# Patient Record
Sex: Female | Born: 1955 | Race: White | Hispanic: No | Marital: Married | Smoking: Never smoker
Health system: Southern US, Community
[De-identification: ages and names within clinical notes are randomized; demographics above are authoritative.]

## PROBLEM LIST (undated history)

## (undated) DIAGNOSIS — I10 Essential (primary) hypertension: Secondary | ICD-10-CM

## (undated) DIAGNOSIS — G500 Trigeminal neuralgia: Secondary | ICD-10-CM

## (undated) DIAGNOSIS — C4490 Unspecified malignant neoplasm of skin, unspecified: Secondary | ICD-10-CM

## (undated) HISTORY — PX: BREAST LUMPECTOMY: SHX2

## (undated) HISTORY — PX: TUBAL LIGATION: SHX77

## (undated) HISTORY — DX: Trigeminal neuralgia: G50.0

## (undated) HISTORY — PX: OTHER SURGICAL HISTORY: SHX169

---

## 2012-05-18 ENCOUNTER — Emergency Department (HOSPITAL_COMMUNITY): Payer: BC Managed Care – PPO

## 2012-05-18 ENCOUNTER — Encounter (HOSPITAL_COMMUNITY): Payer: Self-pay | Admitting: Emergency Medicine

## 2012-05-18 ENCOUNTER — Emergency Department (HOSPITAL_COMMUNITY)
Admission: EM | Admit: 2012-05-18 | Discharge: 2012-05-18 | Disposition: A | Discharge status: Home / Self Care | Payer: BC Managed Care – PPO | Attending: Emergency Medicine | Admitting: Emergency Medicine

## 2012-05-18 DIAGNOSIS — Z880 Allergy status to penicillin: Secondary | ICD-10-CM | POA: Insufficient documentation

## 2012-05-18 DIAGNOSIS — IMO0002 Reserved for concepts with insufficient information to code with codable children: Secondary | ICD-10-CM | POA: Insufficient documentation

## 2012-05-18 DIAGNOSIS — S93409A Sprain of unspecified ligament of unspecified ankle, initial encounter: Secondary | ICD-10-CM

## 2012-05-18 DIAGNOSIS — S96819A Strain of other specified muscles and tendons at ankle and foot level, unspecified foot, initial encounter: Secondary | ICD-10-CM | POA: Insufficient documentation

## 2012-05-18 DIAGNOSIS — S93499A Sprain of other ligament of unspecified ankle, initial encounter: Secondary | ICD-10-CM | POA: Insufficient documentation

## 2012-05-18 DIAGNOSIS — S6390XA Sprain of unspecified part of unspecified wrist and hand, initial encounter: Secondary | ICD-10-CM

## 2012-05-18 DIAGNOSIS — W010XXA Fall on same level from slipping, tripping and stumbling without subsequent striking against object, initial encounter: Secondary | ICD-10-CM | POA: Insufficient documentation

## 2012-05-18 DIAGNOSIS — G500 Trigeminal neuralgia: Secondary | ICD-10-CM | POA: Insufficient documentation

## 2012-05-18 MED ORDER — TRAMADOL HCL 50 MG PO TABS: 50.0000 mg | ORAL_TABLET | Freq: Four times a day (QID) | ORAL | Status: AC | PRN

## 2012-05-18 NOTE — ED Provider Notes (Signed)
History     CSN: [id number]  Arrival date & time 05/18/12  1029   First MD Initiated Contact with Patient 05/18/12 1043      Chief Complaint  Patient presents with  . Ankle Pain  . Hand Pain    (Consider location/radiation/quality/duration/timing/severity/associated sxs/prior treatment) Patient is a 56 y.o. female presenting with fall. The history is provided by the patient.  Fall The accident occurred yesterday. The fall occurred while walking. The pain is present in the left wrist (left ankle). The pain is at a severity of 5/10. Pertinent negatives include no fever and no numbness.  Pt states she got tripped by a dog, and she fell down onto left side. States pain and swelling to left ankle and left hand. Pain with walking, bearing weight, moving left thumb   Past Medical History  Diagnosis Date  . Trigeminal neuralgia     Past Surgical History  Procedure Date  . Endometrial ablasion     No family history on file.  History  Substance Use Topics  . Smoking status: Never Smoker   . Smokeless tobacco: Not on file  . Alcohol Use: Yes    OB History    Grav Para Term Preterm Abortions TAB SAB Ect Mult Living                  Review of Systems  Constitutional: Negative for fever and chills.  Respiratory: Negative.   Cardiovascular: Negative.   Musculoskeletal: Positive for joint swelling.  Skin: Negative.   Neurological: Negative for weakness and numbness.    Allergies  Penicillins  Home Medications   Current Outpatient Rx  Name Route Sig Dispense Refill  . GABAPENTIN 100 MG PO CAPS Oral Take 200 mg by mouth 2 (two) times daily.    Marland Kitchen NAPROXEN SODIUM 220 MG PO TABS Oral Take 220 mg by mouth daily as needed. For pain      BP 126/77  Pulse 89  Temp 98.4 F (36.9 C) (Oral)  Resp 16  Ht 5\' 8"  (1.727 m)  Wt 200 lb (90.719 kg)  BMI 30.41 kg/m2  SpO2 98%  Physical Exam  Nursing note and vitals reviewed. Constitutional: She is oriented to person, place,  and time. She appears well-developed and well-nourished. No distress.  HENT:  Head: Normocephalic.  Eyes: Conjunctivae are normal.  Cardiovascular: Normal rate, regular rhythm and normal heart sounds.   Pulmonary/Chest: Effort normal and breath sounds normal. No respiratory distress. She has no wheezes. She has no rales.  Musculoskeletal:       Normal appearing left hand. Pain with left thumb movement. Tenderness over thenar eminence. Normal wrist exam. Pain with palpation over medial and lateral malleoli of the left ankle. Ankle joint appears swollen. Tenderness over dorsum of the foot. Pain with rom of the toes. Dorsal pedal pulses normal  Neurological: She is alert and oriented to person, place, and time.  Skin: Skin is warm and dry.  Psychiatric: She has a normal mood and affect.    ED Course  Procedures (including critical care time)  12:32 PM No results found for this or any previous visit. Dg Ankle Complete Left  05/18/2012  *RADIOLOGY REPORT*  Clinical Data: Pain secondary to a fall.  LEFT ANKLE COMPLETE - 3+ VIEW  Comparison: None.  Findings: There is no fracture, dislocation, or joint effusion. Prominent dorsal spurring of the distal talus.  Small plantar and posterior calcaneal spurs.  IMPRESSION: No acute abnormality of the ankle.  Original Report  Authenticated By: Gwynn Burly, M.D.   Dg Hand Complete Left  05/18/2012  *RADIOLOGY REPORT*  Clinical Data: Pain secondary to a fall.  LEFT HAND - COMPLETE 3+ VIEW  Comparison: None.  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  No fracture or dislocation.  Minimal degenerative arthritic changes of the interphalangeal joints of the fingers.  IMPRESSION: No acute osseous abnormality.  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  Original Report Authenticated By: Gwynn Burly, M.D.   Dg Foot Complete Left  05/18/2012  *RADIOLOGY REPORT*   Clinical Data: Fall.  Mid foot pain.  LEFT FOOT - COMPLETE 3+ VIEW  Comparison: None.  Findings: No acute bony or joint abnormality is identified.  There is some calcaneal spurring.  Small accessory ossicle of the navicular bone is noted.  Soft tissues unremarkable.  IMPRESSION: No acute finding.  Original Report Authenticated By: Bernadene Bell. D'ALESSIO, M.D.    X-rays negative. Neurovascularly intact. VS normal. Suspect ankle and hand sprain. Given crutches and ASO brace. D/c home with close follow up.    1. Ankle sprain   2. Hand sprain       MDM          Lottie Mussel, PA 05/18/12 1556

## 2012-05-18 NOTE — ED Notes (Signed)
Pt reports a dog tripped her last night causing her to fall and waking up with left ankle pain/swelling and left hand pain

## 2012-05-19 NOTE — ED Provider Notes (Signed)
Medical screening examination/treatment/procedure(s) were performed by non-physician practitioner and as supervising physician I was immediately available for consultation/collaboration.  Traquan Duarte, MD 05/19/12 0759 

## 2013-06-25 IMAGING — CR DG HAND COMPLETE 3+V*L*
3 series · 3 of 3 positions shown · non-contrast
Comparison: None.

CLINICAL DATA: Pain secondary to a fall.

LEFT HAND - COMPLETE 3+ VIEW

[x hand pa left]
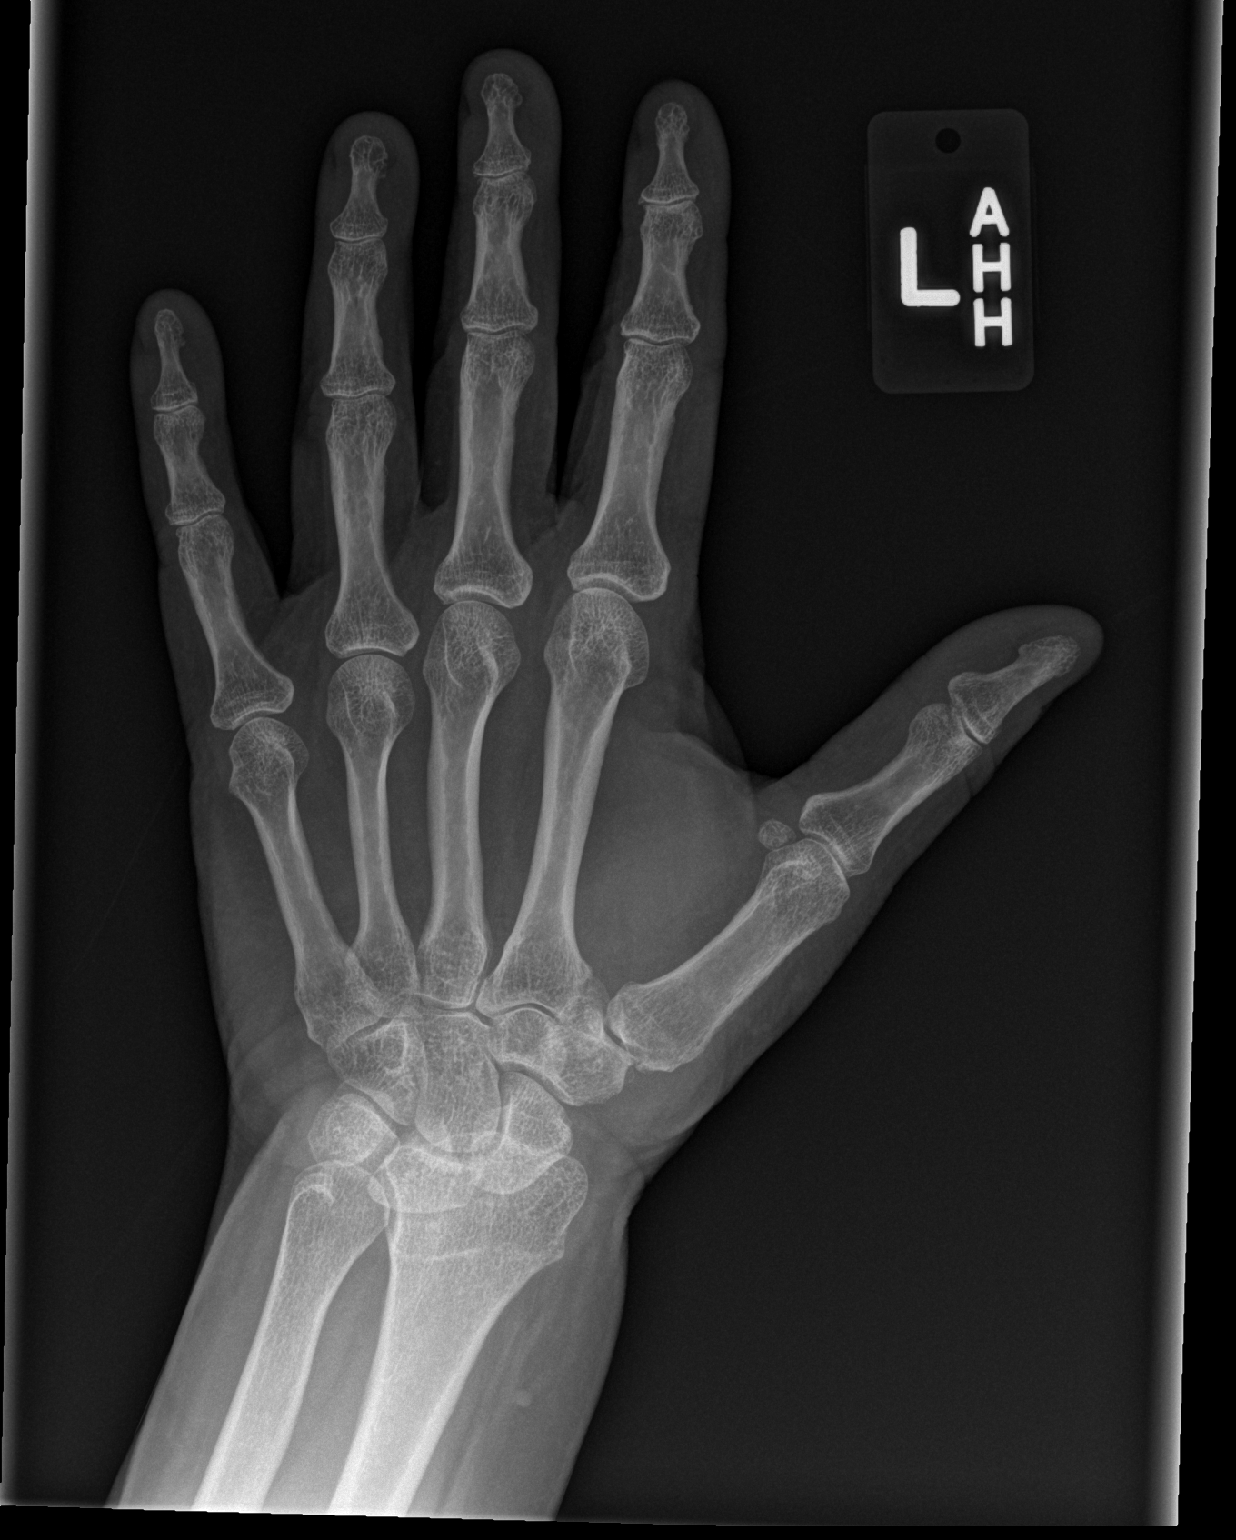

[x hand obl left]
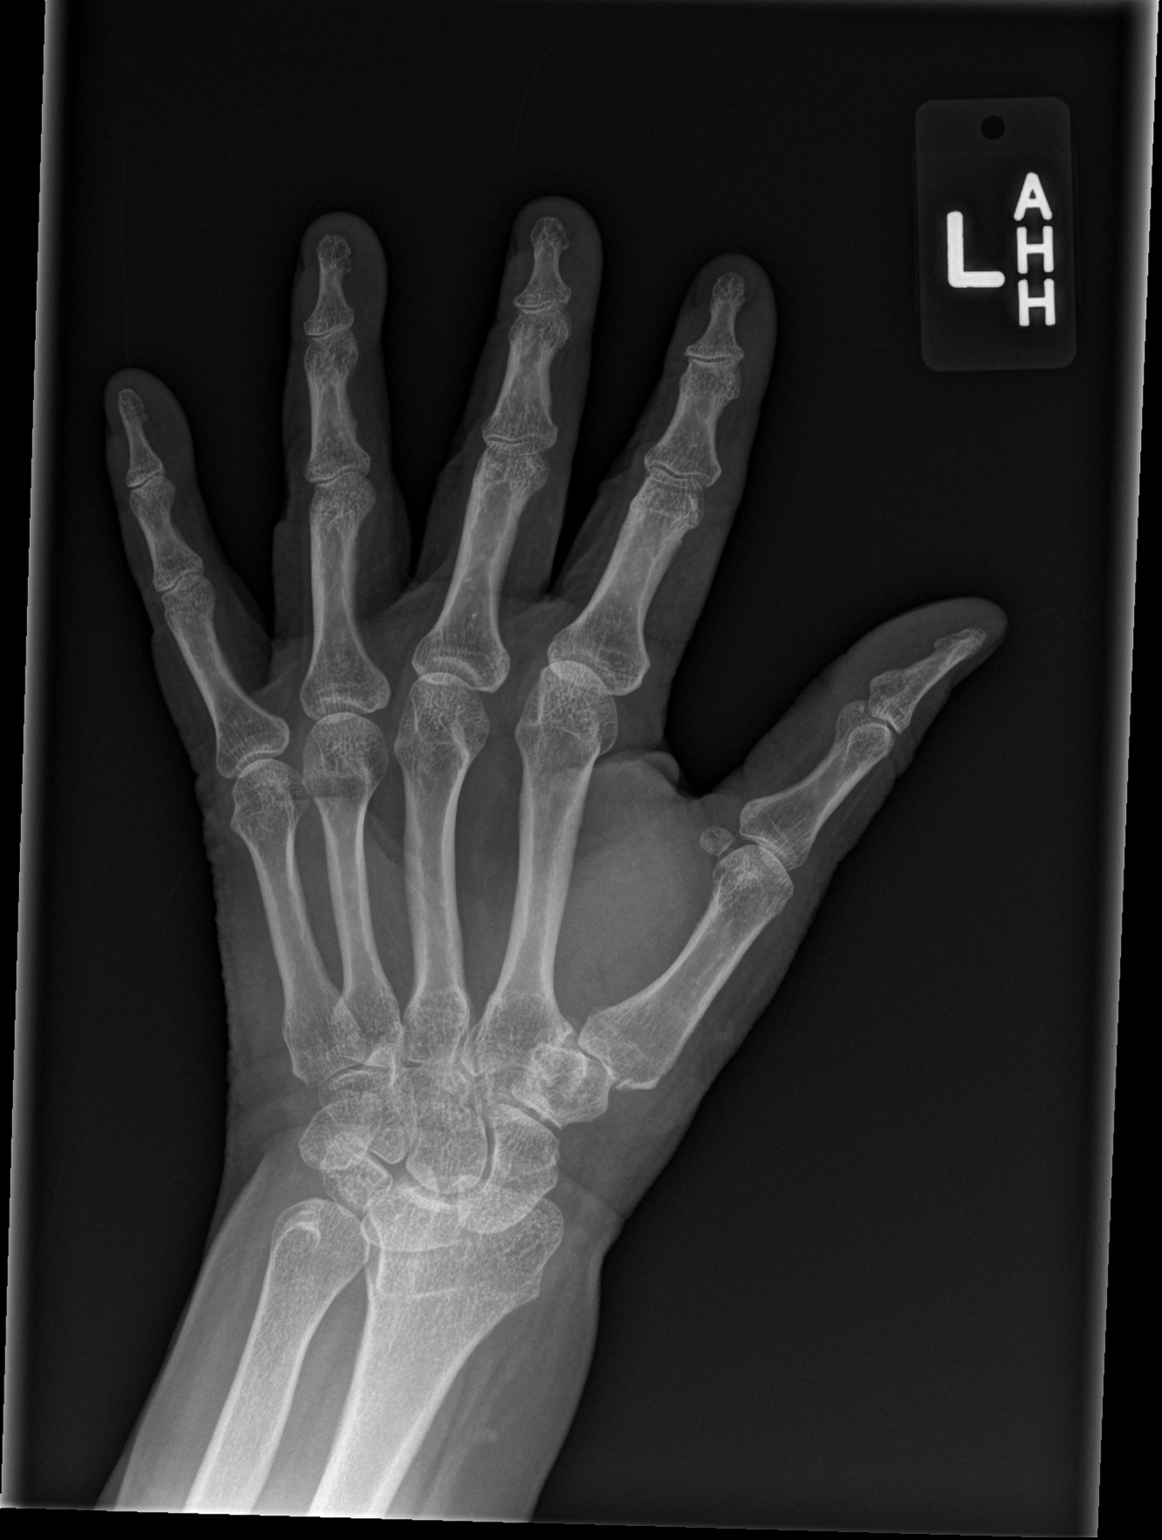

[x hand lat left]
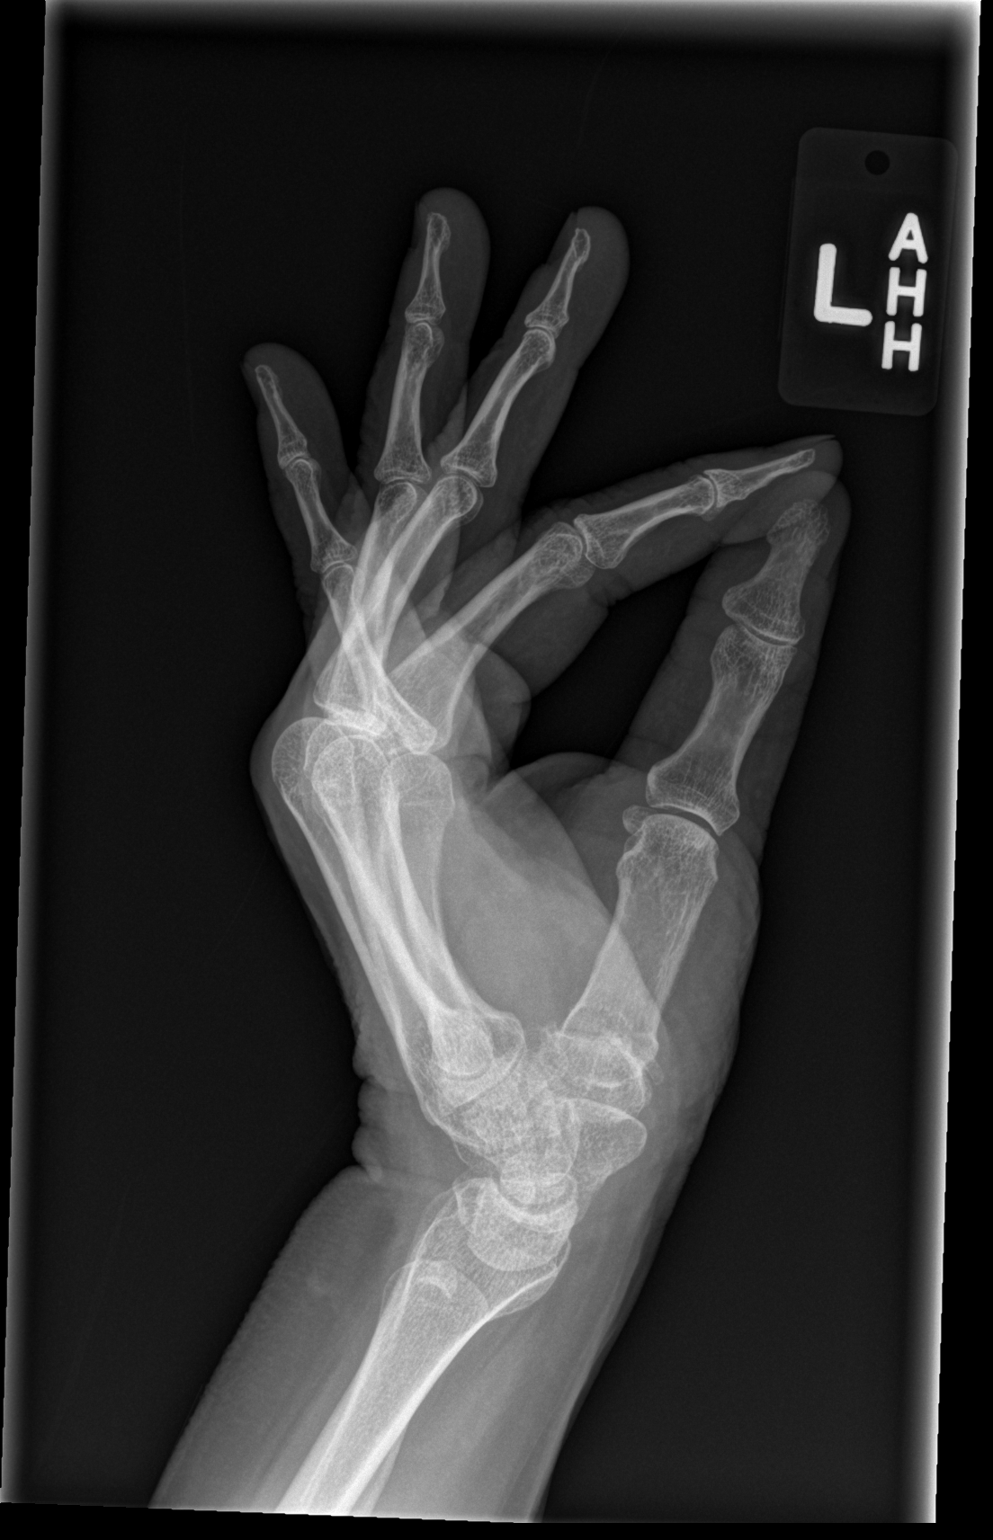

[3 of 3 positions shown; findings below may reference images not displayed]

FINDINGS: The patient has slight osteoarthritic changes of the
first carpal metacarpal joint with osteophyte formation.  No
fracture or dislocation.  Minimal degenerative arthritic changes of
the interphalangeal joints of the fingers.
IMPRESSION: No acute osseous abnormality.  Arthritic changes primarily at the
first carpal metacarpal joint.

## 2014-05-05 ENCOUNTER — Emergency Department (HOSPITAL_COMMUNITY)
Admission: EM | Admit: 2014-05-05 | Discharge: 2014-05-05 | Disposition: A | Discharge status: Home / Self Care | Payer: 59 | Attending: Emergency Medicine | Admitting: Emergency Medicine

## 2014-05-05 ENCOUNTER — Emergency Department (HOSPITAL_COMMUNITY): Payer: 59

## 2014-05-05 ENCOUNTER — Encounter (HOSPITAL_COMMUNITY): Payer: Self-pay | Admitting: Emergency Medicine

## 2014-05-05 DIAGNOSIS — Z792 Long term (current) use of antibiotics: Secondary | ICD-10-CM | POA: Insufficient documentation

## 2014-05-05 DIAGNOSIS — Z880 Allergy status to penicillin: Secondary | ICD-10-CM | POA: Insufficient documentation

## 2014-05-05 DIAGNOSIS — Z79899 Other long term (current) drug therapy: Secondary | ICD-10-CM | POA: Insufficient documentation

## 2014-05-05 DIAGNOSIS — Z8669 Personal history of other diseases of the nervous system and sense organs: Secondary | ICD-10-CM | POA: Insufficient documentation

## 2014-05-05 DIAGNOSIS — R51 Headache: Secondary | ICD-10-CM | POA: Insufficient documentation

## 2014-05-05 DIAGNOSIS — R519 Headache, unspecified: Secondary | ICD-10-CM

## 2014-05-05 MED ORDER — GABAPENTIN 300 MG PO CAPS: 300.0000 mg | ORAL_CAPSULE | Freq: Three times a day (TID) | ORAL | Status: DC

## 2014-05-05 NOTE — ED Notes (Addendum)
Pt c/o left side headache, sharp pain on left. Denies n/v blurred vision. Pt states left side of forehead is swollen and when pain happens it gets worse.

## 2014-05-05 NOTE — Progress Notes (Signed)
  CARE MANAGEMENT ED NOTE 05/05/2014  [facility]   Account Number:  [id number]  Date Initiated:  05/05/2014  Documentation initiated by:  Radford PaxFERRERO,Chrisanne Loose  Subjective/Objective Assessment:   Patient presents to Ed with left sided headache     Subjective/Objective Assessment Detail:     Action/Plan:   Action/Plan Detail:   Anticipated DC Date:       Status Recommendation to Physician:   Result of Recommendation:    Other ED Services  Consult Working Plan    DC Planning Services  Other  PCP issues    Choice offered to / List presented to:            Status of service:  Completed, signed off  ED Comments:   ED Comments Detail:  EDCM spoke to patient at bedside.  Patient confirms her pcp is [facility].  System updated.

## 2014-05-05 NOTE — ED Provider Notes (Signed)
CSN: [id number]     Arrival date & time 05/05/14  1539 History   First MD Initiated Contact with Patient 05/05/14 1901     Chief Complaint  Patient presents with  . Headache      HPI Patient reports intermittent shooting type left sided headache focused around her left eye without any significant changes in her vision.  She states when the pain comes it lasts for approximately 1 minute.  Her family reports that she gets a spasm appearance of her muscles around her left face and in her symptoms resolved.  She states the pain comes it is so severe she cannot talk or do any activities.  It then resolves and she is about a day.  She has a history of trigeminal neuralgia and she states this feels slightly similar but more severe.  She used to be on Neurontin but she is no longer prescribe this.  Approximately 2 and half weeks ago she started taking her sister's prescription and is currently taking 300 mg 3 times a day without significant relief in her symptoms.  She has not seen her primary care physician or neurologist about this.  She denies weakness or numbness of her upper lower extremities.  No other complaints.  No fevers or chills.  No ear pain.   Past Medical History  Diagnosis Date  . Trigeminal neuralgia    Past Surgical History  Procedure Laterality Date  . Endometrial ablasion     No family history on file. History  Substance Use Topics  . Smoking status: Never Smoker   . Smokeless tobacco: Not on file  . Alcohol Use: Yes   OB History   Grav Para Term Preterm Abortions TAB SAB Ect Mult Living                 Review of Systems  All other systems reviewed and are negative.     Allergies  Penicillins  Home Medications   Prior to Admission medications   Medication Sig Start Date End Date Taking? Authorizing Provider  ALPRAZolam Prudy Feeler(XANAX) 0.25 MG tablet Take 0.25 mg by mouth daily as needed for anxiety.   Yes Historical Provider, MD  azithromycin (AZASITE) 1 %  ophthalmic solution Place 1 drop into the left eye daily.   Yes Historical Provider, MD  gabapentin (NEURONTIN) 300 MG capsule Take 300 mg by mouth 3 (three) times daily.   Yes Historical Provider, MD  Multiple Vitamin (MULTIVITAMIN WITH MINERALS) TABS tablet Take 1 tablet by mouth daily.   Yes Historical Provider, MD  naproxen sodium (ANAPROX) 220 MG tablet Take 220 mg by mouth daily as needed. For pain   Yes Historical Provider, MD   BP 163/86  Pulse 89  Temp(Src) 97.9 F (36.6 C) (Oral)  Resp 16  SpO2 97% Physical Exam  Nursing note and vitals reviewed. Constitutional: She is oriented to person, place, and time. She appears well-developed and well-nourished. No distress.  HENT:  Head: Normocephalic and atraumatic.  No significant tenderness around her temporal region.  No skin changes.  No signs of periorbital cellulitis on the left.  No facial swelling or masses noted.  No tenderness along her left facial nerve at the origin  Eyes: EOM are normal. Pupils are equal, round, and reactive to light. Left eye exhibits no chemosis and no discharge. Left conjunctiva is not injected. Left conjunctiva has no hemorrhage. Left eye exhibits normal extraocular motion and no nystagmus.  Neck: Normal range of motion.  Cardiovascular: Normal rate,  regular rhythm and normal heart sounds.   Pulmonary/Chest: Effort normal and breath sounds normal.  Abdominal: Soft. She exhibits no distension. There is no tenderness.  Musculoskeletal: Normal range of motion.  Neurological: She is alert and oriented to person, place, and time.  Skin: Skin is warm and dry.  Psychiatric: She has a normal mood and affect. Judgment normal.    ED Course  Procedures (including critical care time) Labs Review Labs Reviewed - No data to display  Imaging Review Ct Head Wo Contrast  05/05/2014   CLINICAL DATA:  Left parietal and left eye pain for 2.5 years.  EXAM: CT HEAD WITHOUT CONTRAST  TECHNIQUE: Contiguous axial images  were obtained from the base of the skull through the vertex without intravenous contrast.  COMPARISON:  None.  FINDINGS: Ventricles and sulci appear symmetrical. No mass effect or midline shift. No abnormal extra-axial fluid collections. Gray-white matter junctions are distinct. Basal cisterns are not effaced. No evidence of acute intracranial hemorrhage. No depressed skull fractures. Visualized paranasal sinuses and mastoid air cells are not opacified.  IMPRESSION: No acute intracranial abnormalities.   Electronically Signed   By: Burman NievesWilliam  Stevens M.D.   On: 05/05/2014 21:27  I personally reviewed the imaging tests through PACS system I reviewed available ER/hospitalization records through the EMR    EKG Interpretation None      MDM   Final diagnoses:  Headache, unspecified headache type    Outpatient neurology followup.  Patient be prescribe Neurontin    Lyanne CoKevin M Campos, MD 05/05/14 2210

## 2014-05-13 ENCOUNTER — Ambulatory Visit (INDEPENDENT_AMBULATORY_CARE_PROVIDER_SITE_OTHER): Payer: 59 | Admitting: Neurology

## 2014-05-13 ENCOUNTER — Encounter: Payer: Self-pay | Admitting: Neurology

## 2014-05-13 VITALS — BP 131/86 | HR 79 | Ht 66.5 in | Wt 210.0 lb

## 2014-05-13 DIAGNOSIS — G500 Trigeminal neuralgia: Secondary | ICD-10-CM

## 2014-05-13 HISTORY — DX: Trigeminal neuralgia: G50.0

## 2014-05-13 MED ORDER — GABAPENTIN 600 MG PO TABS: 600.0000 mg | ORAL_TABLET | Freq: Three times a day (TID) | ORAL | Status: DC

## 2014-05-13 NOTE — Patient Instructions (Signed)
Trigeminal Neuralgia  Trigeminal neuralgia is a nerve disorder that causes sudden attacks of severe facial pain. It is caused by damage to the trigeminal nerve, a major nerve in the face. It is more common in women and in the elderly, although it can also happen in younger patients. Attacks last from a few seconds to several minutes and can occur from a couple of times per year to several times per day. Trigeminal neuralgia can be a very distressing and disabling condition. Surgery may be needed in very severe cases if medical treatment does not give relief.  HOME CARE INSTRUCTIONS    If your caregiver prescribed medication to help prevent attacks, take as directed.   To help prevent attacks:   Chew on the unaffected side of the mouth.   Avoid touching your face.   Avoid blasts of hot or cold air.   Men may wish to grow a beard to avoid having to shave.  SEEK IMMEDIATE MEDICAL CARE IF:   Pain is unbearable and your medicine does not help.   You develop new, unexplained symptoms (problems).   You have problems that may be related to a medication you are taking.  Document Released: 10/28/2000 Document Revised: 01/23/2012 Document Reviewed: 08/28/2009  ExitCare Patient Information 2015 [employer]. This information is not intended to replace advice given to you by your health care provider. Make sure you discuss any questions you have with your health care provider.

## 2014-05-13 NOTE — Progress Notes (Signed)
Reason for visit: Trigeminal neuralgia  Tracy Campbell is a 58 y.o. female  History of present illness:  Tracy Campbell is a 58 year old right-handed white female with a history of left V1 distribution neuralgia pain that began about 3 years ago. She was seen by Dr. 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 The patient was placed on gabapentin, taking 300 mg 3 times daily with good benefit. The patient stopped having neuralgia pain recently, and went off the medication for 6-8 months without any pain. The pain recurred 3 months ago, and the patient has gone back on her gabapentin taking 300 mg 3 times daily, but the pain has not come under complete control. She is still having at least 2 episodes of electrical shock pains around the left eye and nose and forehead that may last up to 30 seconds. Wind blowing on her face may bring on the episodes. Touching the left nose may also initiate the neuralgia pain. She also has dull achy pains all over her head off and on that are unrelated to the neuralgia pain. The patient however, also reports some numbness involving the left face. She recently was on prednisone for an eye infection, and this did help her neuralgia pain some. She denies any numbness or weakness of the extremities, and she denies any problems controlling the bowels or the bladder. She does report some vertigo sensations at times, and she will have some gait instability during that period of time that she is dizzy. She has not had any blackout episodes. She comes to this office for an evaluation.  Past Medical History  Diagnosis Date  . Trigeminal neuralgia of left side of face 05/13/2014    Past Surgical History  Procedure Laterality Date  . Endometrial ablasion    . Tubal ligation    . Breast lumpectomy      left benign    Family  History  Problem Relation Age of Onset  . Alzheimer's disease Mother   . Dementia Father     Social history:  reports that she has quit smoking. Her smoking use included Cigarettes. She smoked 0.00 packs per day. She has never used smokeless tobacco. She reports that she drinks alcohol. She reports that she does not use illicit drugs.  Medications:  Current Outpatient Prescriptions on File Prior to Visit  Medication Sig Dispense Refill  . ALPRAZolam (XANAX) 0.25 MG tablet Take 0.25 mg by mouth daily as needed for anxiety.      . Multiple Vitamin (MULTIVITAMIN WITH MINERALS) TABS tablet Take 1 tablet by mouth daily.      . naproxen sodium (ANAPROX) 220 MG tablet Take 220 mg by mouth daily as needed. For pain       No current facility-administered medications on file prior to visit.      Allergies  Allergen Reactions  . Penicillins Anaphylaxis    ROS:  Out of a complete 14 system review of symptoms, the patient complains only of the following symptoms, and all other reviewed systems are negative.  Vertigo Blurred vision, eye pain Shortness of breath, wheezing, snoring Feeling hot, flushing Headache, numbness, dizziness Anxiety, not enough sleep Sleepiness, snoring  Blood pressure 131/86, pulse 79, height 5' 6.5" (1.689 m), weight 210 lb (95.255 kg).  Physical Exam  General: The patient is alert and cooperative at the time of  the examination.  Eyes: Pupils are equal, round, and reactive to light. Discs are flat bilaterally.  Neck: The neck is supple, no carotid bruits are noted.  Respiratory: The respiratory examination is clear.  Cardiovascular: The cardiovascular examination reveals a regular rate and rhythm, no obvious murmurs or rubs are noted.  Skin: Extremities are without significant edema.  Neurologic Exam  Mental status: The patient is alert and oriented x 3 at the time of the examination. The patient has apparent normal recent and remote memory, with an  apparently normal attention span and concentration ability.  Cranial nerves: Facial symmetry is present. There is good sensation of the face to pinprick and soft touch on the right, decreased on the left. The strength of the facial muscles and the muscles to head turning and shoulder shrug are normal bilaterally. Speech is well enunciated, no aphasia or dysarthria is noted. Extraocular movements are full. Visual fields are full. The tongue is midline, and the patient has symmetric elevation of the soft palate. No obvious hearing deficits are noted.  Motor: The motor testing reveals 5 over 5 strength of all 4 extremities. Good symmetric motor tone is noted throughout.  Sensory: Sensory testing is intact to pinprick, soft touch, vibration sensation, and position sense on all 4 extremities, with the exception that the pinprick sensation is decreased on the left arm relative to the right. No evidence of extinction is noted.  Coordination: Cerebellar testing reveals good finger-nose-finger and heel-to-shin bilaterally.  Gait and station: Gait is normal. Tandem gait is normal. Romberg is negative. No drift is seen.  Reflexes: Deep tendon reflexes are symmetric and normal bilaterally. Toes are downgoing bilaterally.   CT head 05/05/2014:  IMPRESSION:  No acute intracranial abnormalities.    Assessment/Plan:  1. Left V1 trigeminal neuralgia  The patient reports a history of neuralgia-type pain on the left upper face that dates back about 3 years. The history and examination is atypical for trigeminal neuralgia in that she has sensory alteration on the entirety of the left face and on the left arm. The patient will be sent for a repeat MRI of the brain with and without gadolinium enhancement. She does not believe that she received gadolinium enhancement on the prior MRI. She will be increased on the gabapentin taking 600 mg 3 times a day, phasing in the 600 mg tablets by one every 5 days, replacing  the 300 mg tablets. The patient may require carbamazepine in addition if the gabapentin is not helpful. She will followup in 3-4 months.  Marlan Palau. Keith Willis MD 05/13/2014 9:34 PM  [facility] [address] [place], [place] [id number]  Phone [phone] Fax [phone]

## 2014-05-26 ENCOUNTER — Telehealth: Payer: Self-pay | Admitting: Neurology

## 2014-05-26 NOTE — Telephone Encounter (Signed)
I called back and spoke with the patient.  Says she does have a Rx for Xanax 0.25mg  but rarely takes them.  Indicates this is not strong enough and would prefer [facility] as a sedative for her take prior to MRI.  Please advise.  Thank you.

## 2014-05-26 NOTE — Telephone Encounter (Signed)
I called patient. The patient has the 0.25 mg tablets, she needs to take 4 tablets before she goes into the scanner, and had an extra 2 tablets to take if needed. The patient wants to have a scan done in an open scanner.

## 2014-05-26 NOTE — Telephone Encounter (Signed)
Patient is having an MRI on 05-28-14 and needs Rx called in to relax patient during MRI--[facility] [place]--thank you.

## 2014-05-28 ENCOUNTER — Telehealth: Payer: Self-pay | Admitting: Neurology

## 2014-05-28 ENCOUNTER — Ambulatory Visit
Admission: RE | Admit: 2014-05-28 | Discharge: 2014-05-28 | Disposition: A | Discharge status: Home / Self Care | Payer: 59 | Source: Ambulatory Visit | Attending: Neurology | Admitting: Neurology

## 2014-05-28 DIAGNOSIS — G500 Trigeminal neuralgia: Secondary | ICD-10-CM

## 2014-05-28 MED ORDER — GADOBENATE DIMEGLUMINE 529 MG/ML IV SOLN
20.0000 mL | Freq: Once | INTRAVENOUS | Status: AC | PRN
  Administered 2014-05-28: 20 mL via INTRAVENOUS

## 2014-05-28 NOTE — Telephone Encounter (Signed)
  I called patient. The MRI the brain shows an occasional subcortical white matter lesion that is stable from 2013. Study is otherwise unremarkable.   MRI brain 05/28/2014:  Impression   Abnormal MRI brain (with and without) demonstrating: 1. 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 Left peri-atrial cystic lesion. No abnormal lesions are seen on  post contrast views. These findings are non-specific and considerations  include autoimmune, inflammatory, post-infectious, microvascular ischemic  or migraine associated etiologies.  2. No acute findings. 3. No significant change from MRI on 03/12/12.

## 2014-09-12 ENCOUNTER — Ambulatory Visit: Payer: 59 | Admitting: Adult Health

## 2015-06-12 IMAGING — CT CT HEAD W/O CM
1 of 2 series · 16 of 30 positions shown, 20 images · non-contrast
Comparison: None.

CLINICAL DATA: Left parietal and left eye pain for 2.5 years.

EXAM:
CT HEAD WITHOUT CONTRAST
TECHNIQUE: Contiguous axial images were obtained from the base of the skull
through the vertex without intravenous contrast.

[Series 3: headseq 2.4 h60s · axial · 0.41mm/px · z∈[+1212,+1367]mm · 16 of 72 slices shown, 20 images]
[im 4/72  brain]
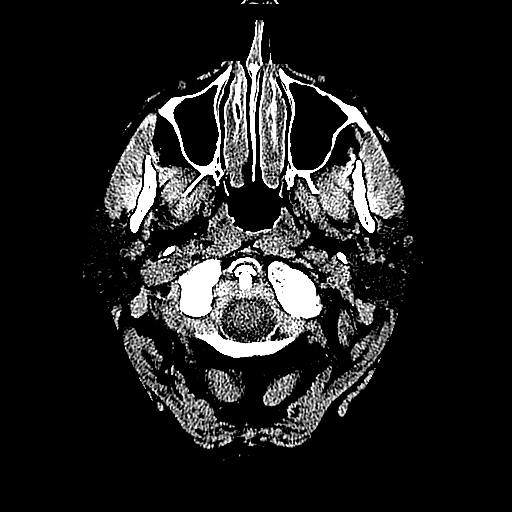
[im 4/72  bone]
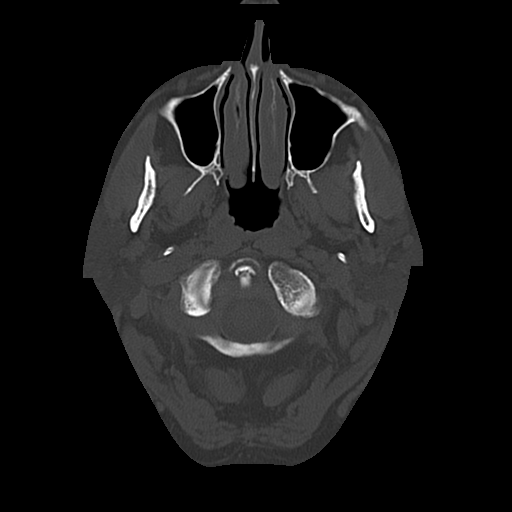
[im 8/72  brain]
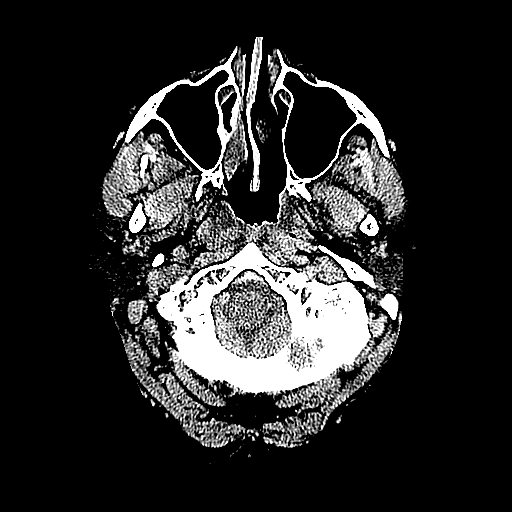
[im 12/72  brain]
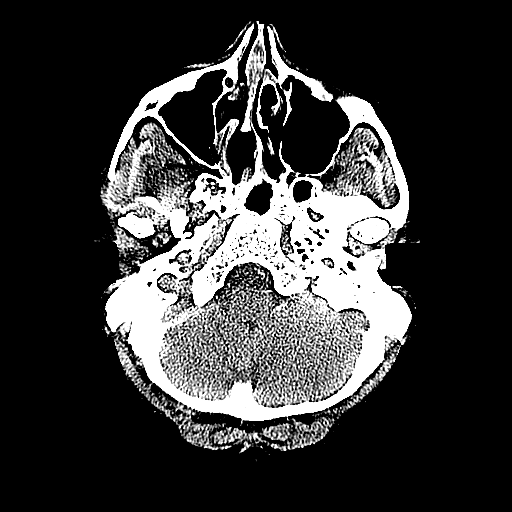
[im 15/72  brain]
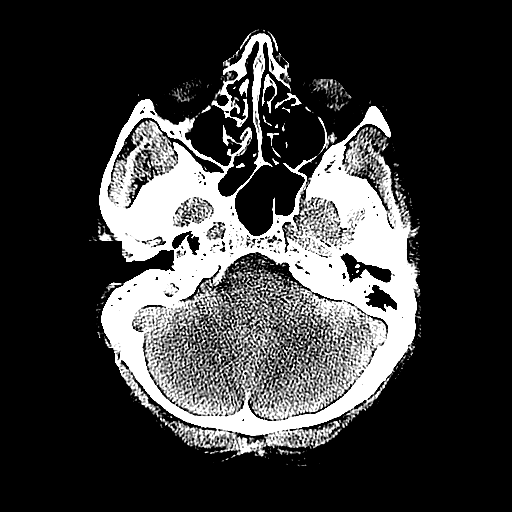
[im 19/72  brain]
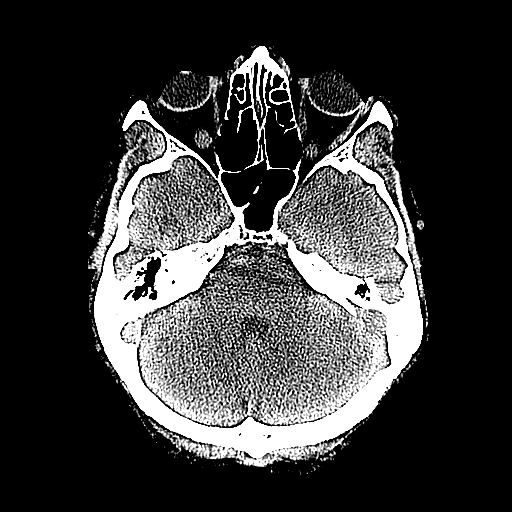
[im 19/72  bone]
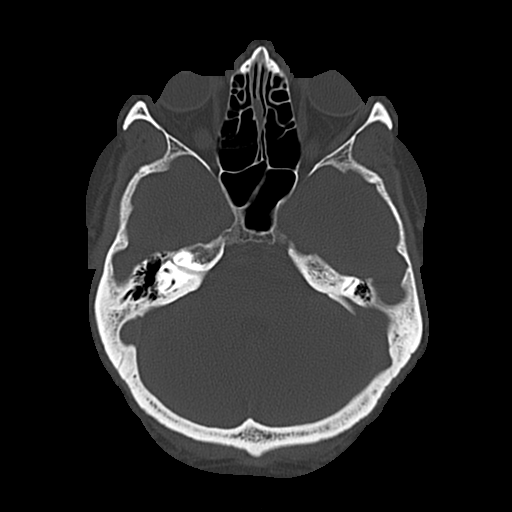
[im 23/72  brain]
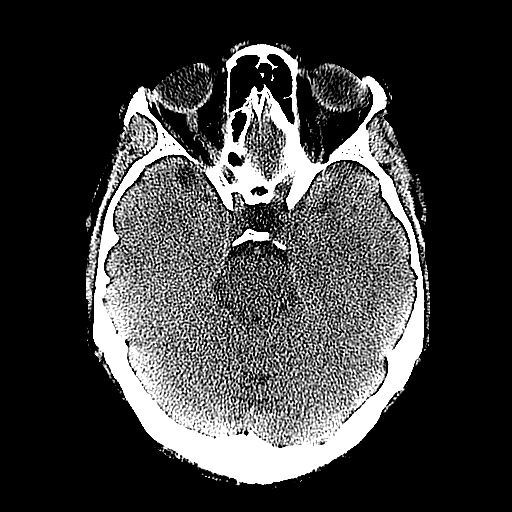
[im 27/72  brain]
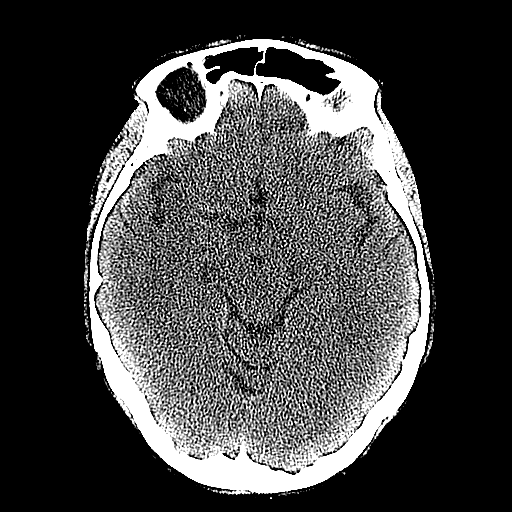
[im 30/72  brain]
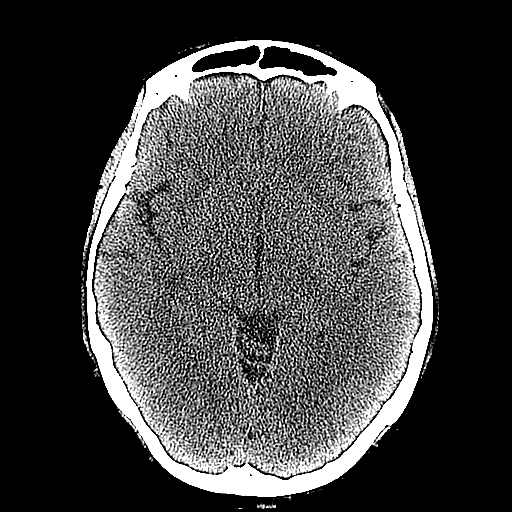
[im 38/72  brain]
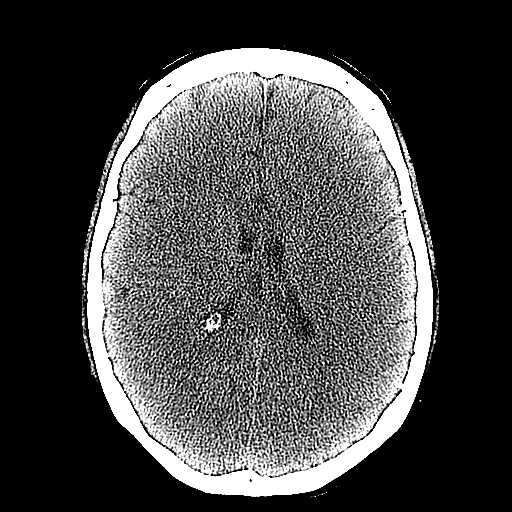
[im 38/72  bone]
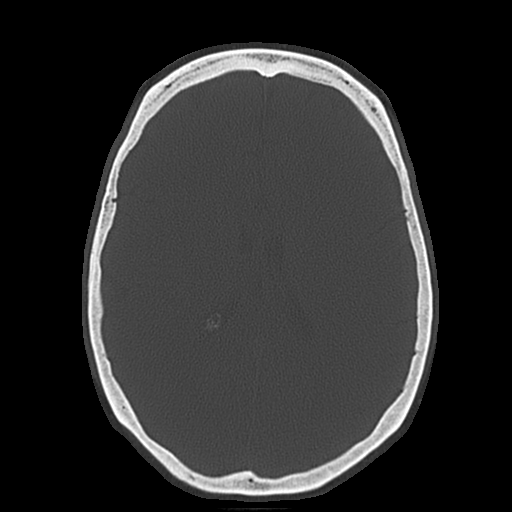
[im 42/72  brain]
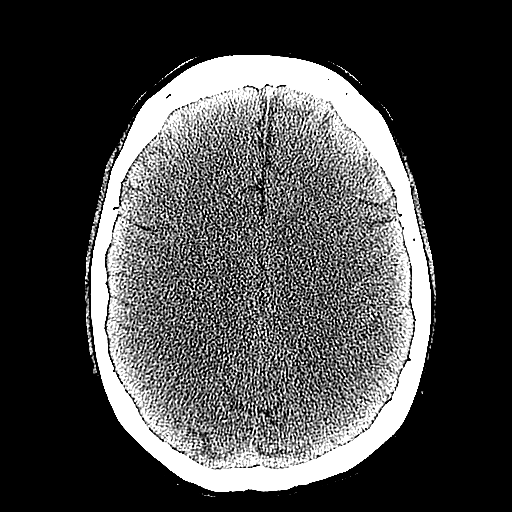
[im 45/72  brain]
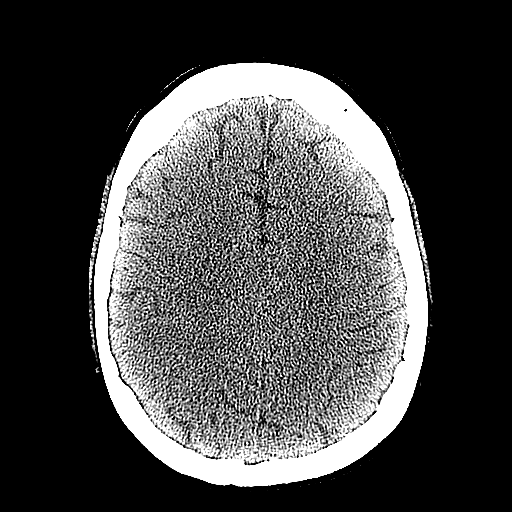
[im 49/72  brain]
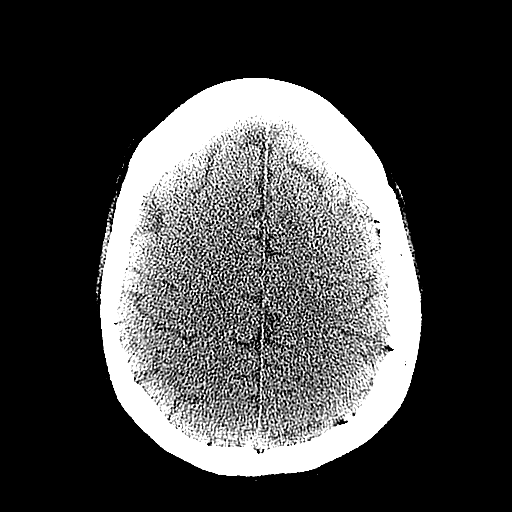
[im 57/72  brain]
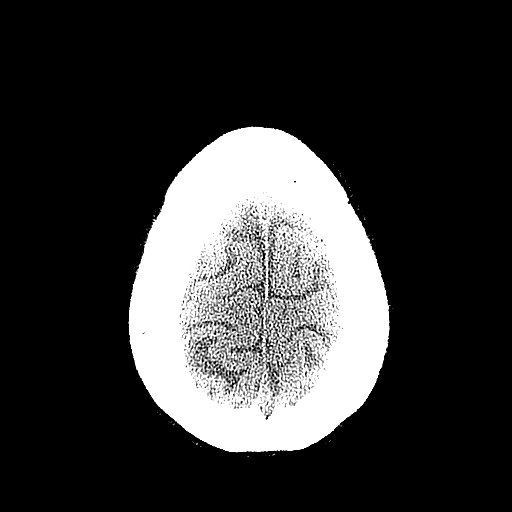
[im 57/72  bone]
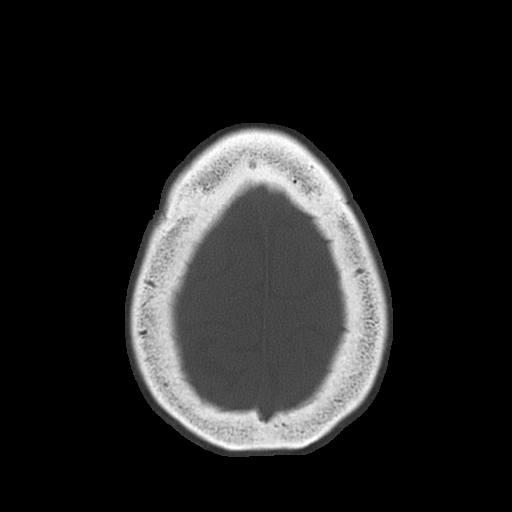
[im 60/72  brain]
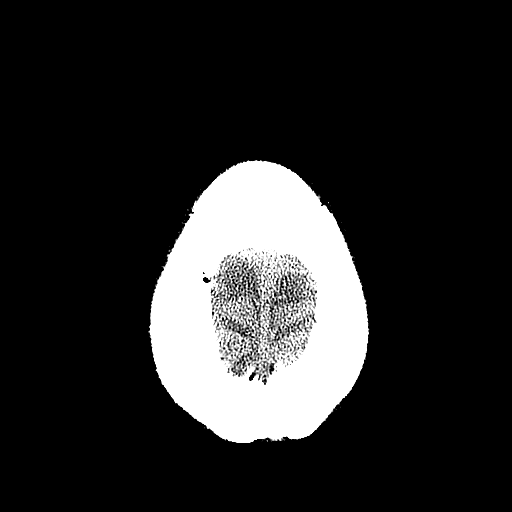
[im 64/72  brain]
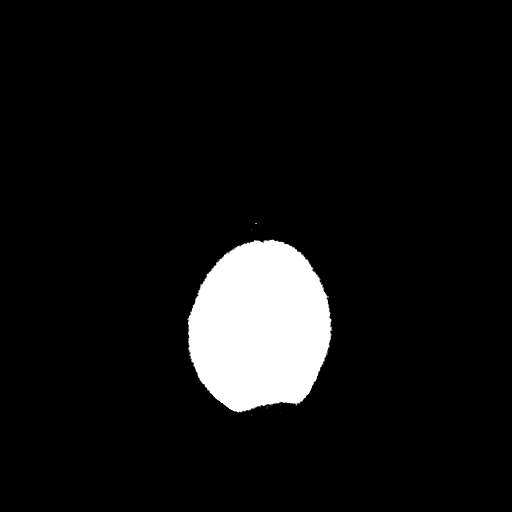
[im 68/72  brain]
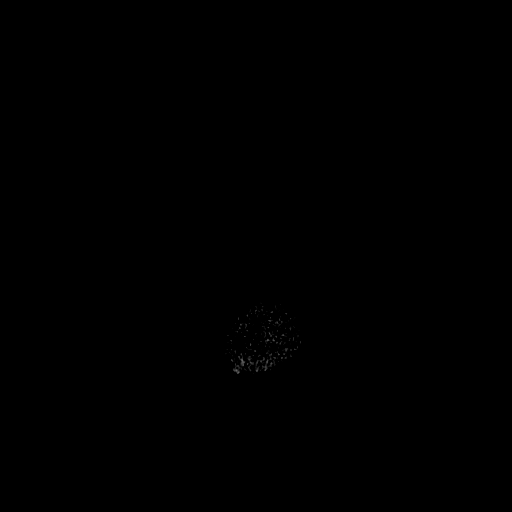

[16 of 30 positions shown; findings below may reference images not displayed]

FINDINGS: Ventricles and sulci appear symmetrical. No mass effect or midline
shift. No abnormal extra-axial fluid collections. Gray-white matter
junctions are distinct. Basal cisterns are not effaced. No evidence
of acute intracranial hemorrhage. No depressed skull fractures.
Visualized paranasal sinuses and mastoid air cells are not
opacified.
IMPRESSION: No acute intracranial abnormalities.

## 2015-07-13 ENCOUNTER — Other Ambulatory Visit: Payer: Self-pay | Admitting: Neurology

## 2018-01-10 ENCOUNTER — Ambulatory Visit: Payer: Self-pay | Admitting: Emergency Medicine

## 2018-01-10 ENCOUNTER — Encounter: Payer: Self-pay | Admitting: Emergency Medicine

## 2018-01-10 VITALS — BP 130/78 | HR 85 | Temp 98.3°F | Wt 216.4 lb

## 2018-01-10 DIAGNOSIS — J40 Bronchitis, not specified as acute or chronic: Secondary | ICD-10-CM

## 2018-01-10 MED ORDER — AZITHROMYCIN 250 MG PO TABS: ORAL_TABLET | ORAL | 0 refills | Status: AC

## 2018-01-10 NOTE — Progress Notes (Signed)
Subjective:     Tracy Campbell is a 62 y.o. female who presents for evaluation of nonproductive cough. Symptoms began 2 weeks ago. Symptoms have been unchanged since that time. Past history is significant for URI 2 weeks ago, treated with OTC medications without relief. Pt does not smoke, no hx of asthma or COPD  The following portions of the patient's history were reviewed and updated as appropriate: allergies and current medications.  Review of Systems Pertinent items noted in HPI and remainder of comprehensive ROS otherwise negative.    Objective:    BP 130/78   Pulse 85   Temp 98.3 F (36.8 C)   Wt 216 lb 6.4 oz (98.2 kg)   SpO2 95%   BMI 34.40 kg/m  General appearance: alert, cooperative and appears stated age Ears: normal TM's and external ear canals both ears Nose: Nares normal. Septum midline. Mucosa normal. No drainage or sinus tenderness. Throat: lips, mucosa, and tongue normal; teeth and gums normal Neck: no adenopathy Lungs: clear to auscultation bilaterally Heart: regular rate and rhythm Extremities: extremities normal, atraumatic, no cyanosis or edema Pulses: 2+ and symmetric    Assessment:    Acute Bronchitis    Plan:   Azithromycin, Mucinex DM, rest, fluids, follow up as needed  Worsening signs and symptoms discussed. Rest, fluids, acetaminophen, and humidification. Follow up as needed for persistent, worsening cough, or appearance of new symptoms.

## 2018-01-10 NOTE — Patient Instructions (Signed)

## 2018-03-26 ENCOUNTER — Ambulatory Visit: Payer: Self-pay | Admitting: Nurse Practitioner

## 2018-03-26 VITALS — BP 140/90 | HR 73 | Temp 97.9°F | Resp 16 | Wt 220.6 lb

## 2018-03-26 DIAGNOSIS — H1013 Acute atopic conjunctivitis, bilateral: Secondary | ICD-10-CM

## 2018-03-26 DIAGNOSIS — J0140 Acute pansinusitis, unspecified: Secondary | ICD-10-CM

## 2018-03-26 DIAGNOSIS — J029 Acute pharyngitis, unspecified: Secondary | ICD-10-CM

## 2018-03-26 LAB — POCT RAPID STREP A (OFFICE): Rapid Strep A Screen: NEGATIVE

## 2018-03-26 MED ORDER — IPRATROPIUM BROMIDE 0.06 % NA SOLN: 2.0000 | Freq: Four times a day (QID) | NASAL | 12 refills | Status: AC

## 2018-03-26 MED ORDER — DOXYCYCLINE HYCLATE 100 MG PO TABS: 100.0000 mg | ORAL_TABLET | Freq: Two times a day (BID) | ORAL | 0 refills | Status: AC

## 2018-03-26 MED ORDER — MONTELUKAST SODIUM 10 MG PO TABS: 10.0000 mg | ORAL_TABLET | Freq: Every day | ORAL | 1 refills | Status: AC

## 2018-03-26 MED ORDER — OLOPATADINE HCL 0.1 % OP SOLN: 1.0000 [drp] | Freq: Two times a day (BID) | OPHTHALMIC | 12 refills | Status: AC

## 2018-03-26 NOTE — Progress Notes (Signed)
POC

## 2018-03-26 NOTE — Patient Instructions (Signed)
Sinusitis, Adult Sinusitis is soreness and inflammation of your sinuses. Sinuses are hollow spaces in the bones around your face. Your sinuses are located:  Around your eyes.  In the middle of your forehead.  Behind your nose.  In your cheekbones.  Your sinuses and nasal passages are lined with a stringy fluid (mucus). Mucus normally drains out of your sinuses. When your nasal tissues become inflamed or swollen, the mucus can become trapped or blocked so air cannot flow through your sinuses. This allows bacteria, viruses, and funguses to grow, which leads to infection. Sinusitis can develop quickly and last for 7?10 days (acute) or for more than 12 weeks (chronic). Sinusitis often develops after a cold. What are the causes? This condition is caused by anything that creates swelling in the sinuses or stops mucus from draining, including:  Allergies.  Asthma.  Bacterial or viral infection.  Abnormally shaped bones between the nasal passages.  Nasal growths that contain mucus (nasal polyps).  Narrow sinus openings.  Pollutants, such as chemicals or irritants in the air.  A foreign object stuck in the nose.  A fungal infection. This is rare.  What increases the risk? The following factors may make you more likely to develop this condition:  Having allergies or asthma.  Having had a recent cold or respiratory tract infection.  Having structural deformities or blockages in your nose or sinuses.  Having a weak immune system.  Doing a lot of swimming or diving.  Overusing nasal sprays.  Smoking.  What are the signs or symptoms? The main symptoms of this condition are pain and a feeling of pressure around the affected sinuses. Other symptoms include:  Upper toothache.  Earache.  Headache.  Bad breath.  Decreased sense of smell and taste.  A cough that may get worse at night.  Fatigue.  Fever.  Thick drainage from your nose. The drainage is often green and  it may contain pus (purulent).  Stuffy nose or congestion.  Postnasal drip. This is when extra mucus collects in the throat or back of the nose.  Swelling and warmth over the affected sinuses.  Sore throat.  Sensitivity to light.  How is this diagnosed? This condition is diagnosed based on symptoms, a medical history, and a physical exam. To find out if your condition is acute or chronic, your health care provider may:  Look in your nose for signs of nasal polyps.  Tap over the affected sinus to check for signs of infection.  View the inside of your sinuses using an imaging device that has a light attached (endoscope).  If your health care provider suspects that you have chronic sinusitis, you may also:  Be tested for allergies.  Have a sample of mucus taken from your nose (nasal culture) and checked for bacteria.  Have a mucus sample examined to see if your sinusitis is related to an allergy.  If your sinusitis does not respond to treatment and it lasts longer than 8 weeks, you may have an MRI or CT scan to check your sinuses. These scans also help to determine how severe your infection is. In rare cases, a bone biopsy may be done to rule out more serious types of fungal sinus disease. How is this treated? Treatment for sinusitis depends on the cause and whether your condition is chronic or acute. If a virus is causing your sinusitis, your symptoms will go away on their own within 10 days. You may be given medicines to relieve your symptoms,   including:  Topical nasal decongestants. They shrink swollen nasal passages and let mucus drain from your sinuses.  Antihistamines. These drugs block inflammation that is triggered by allergies. This can help to ease swelling in your nose and sinuses.  Topical nasal corticosteroids. These are nasal sprays that ease inflammation and swelling in your nose and sinuses.  Nasal saline washes. These rinses can help to get rid of thick mucus in  your nose.  If your condition is caused by bacteria, you will be given an antibiotic medicine. If your condition is caused by a fungus, you will be given an antifungal medicine. Surgery may be needed to correct underlying conditions, such as narrow nasal passages. Surgery may also be needed to remove polyps. Follow these instructions at home: Medicines  Take, use, or apply over-the-counter and prescription medicines only as told by your health care provider. These may include nasal sprays.  If you were prescribed an antibiotic medicine, take it as told by your health care provider. Do not stop taking the antibiotic even if you start to feel better. Hydrate and Humidify  Drink enough water to keep your urine clear or pale yellow. Staying hydrated will help to thin your mucus.  Use a cool mist humidifier to keep the humidity level in your home above 50%.  Inhale steam for 10-15 minutes, 3-4 times a day or as told by your health care provider. You can do this in the bathroom while a hot shower is running.  Limit your exposure to cool or dry air. Rest  Rest as much as possible.  Sleep with your head raised (elevated).  Make sure to get enough sleep each night. General instructions  Apply a warm, moist washcloth to your face 3-4 times a day or as told by your health care provider. This will help with discomfort.  Wash your hands often with soap and water to reduce your exposure to viruses and other germs. If soap and water are not available, use hand sanitizer.  Do not smoke. Avoid being around people who are smoking (secondhand smoke).  Keep all follow-up visits as told by your health care provider. This is important. Contact a health care provider if:  You have a fever.  Your symptoms get worse.  Your symptoms do not improve within 10 days. Get help right away if:  You have a severe headache.  You have persistent vomiting.  You have pain or swelling around your face or  eyes.  You have vision problems.  You develop confusion.  Your neck is stiff.  You have trouble breathing. This information is not intended to replace advice given to you by your health care provider. Make sure you discuss any questions you have with your health care provider. Document Released: 10/31/2005 Document Revised: 06/26/2016 Document Reviewed: 08/26/2015 Elsevier Interactive Patient Education  2018 [employer]  Sore Throat A sore throat is pain, burning, irritation, or scratchiness in the throat. When you have a sore throat, you may feel pain or tenderness in your throat when you swallow or talk. Many things can cause a sore throat, including:  An infection.  Seasonal allergies.  Dryness in the air.  Irritants, such as smoke or pollution.  Gastroesophageal reflux disease (GERD).  A tumor.  A sore throat is often the first sign of another sickness. It may happen with other symptoms, such as coughing, sneezing, fever, and swollen neck glands. Most sore throats go away without medical treatment. Follow these instructions at home:  Take over-the-counter  medicines only as told by your health care provider.  Drink enough fluids to keep your urine clear or pale yellow.  Rest as needed.  To help with pain, try: ? Sipping warm liquids, such as broth, herbal tea, or warm water. ? Eating or drinking cold or frozen liquids, such as frozen ice pops. ? Gargling with a salt-water mixture 3-4 times a day or as needed. To make a salt-water mixture, completely dissolve -1 tsp of salt in 1 cup of warm water. ? Sucking on hard candy or throat lozenges. ? Putting a cool-mist humidifier in your bedroom at night to moisten the air. ? Sitting in the bathroom with the door closed for 5-10 minutes while you run hot water in the shower.  Do not use any tobacco products, such as cigarettes, chewing tobacco, and e-cigarettes. If you need help quitting, ask your health care  provider. Contact a health care provider if:  You have a fever for more than 2-3 days.  You have symptoms that last (are persistent) for more than 2-3 days.  Your throat does not get better within 7 days.  You have a fever and your symptoms suddenly get worse. Get help right away if:  You have difficulty breathing.  You cannot swallow fluids, soft foods, or your saliva.  You have increased swelling in your throat or neck.  You have persistent nausea and vomiting. This information is not intended to replace advice given to you by your health care provider. Make sure you discuss any questions you have with your health care provider. Document Released: 12/08/2004 Document Revised: 06/26/2016 Document Reviewed: 08/21/2015 Elsevier Interactive Patient Education  [employer].

## 2018-03-26 NOTE — Progress Notes (Signed)
Subjective:  Tracy Campbell is a 62 y.o. female who presents for evaluation of possible sinusitis.  Symptoms include itching in eyes, nasal congestion, non productive cough, sinus pressure, sinus pain, sore throat and fatigue.  Onset of symptoms was 2 weeks ago, and has been gradually worsening since that time.  Patient states over the last few days she has had dry eye, itching, and noticed some crusting over her eye this morning when she woke up.  Patient denies any mucus appearing drainage from her eyes, but states that she does have tearing throughout the day.  Treatment to date:  antihistamines.  High risk factors for influenza complications:  none.  The following portions of the patient's history were reviewed and updated as appropriate:  allergies, current medications and past medical history.  Constitutional: positive for anorexia, fatigue and suspected fevers but not measured, negative for malaise and sweats Eyes: positive for irritation and redness, negative for color blindness, icterus and visual disturbance Ears, nose, mouth, throat, and face: positive for hoarseness, nasal congestion and sore throat, negative for ear drainage and earaches Respiratory: positive for cough, negative for asthma, chronic bronchitis, dyspnea on exertion, sputum, stridor and wheezing Cardiovascular: negative Gastrointestinal: negative except for decreased appetite Neurological: positive for headaches, negative for coordination problems, dizziness, paresthesia, speech problems, tremors, vertigo and weakness Allergic/Immunologic: positive for hay fever Objective:  BP 140/90 (BP Location: Right Arm, Patient Position: Sitting, Cuff Size: Normal)   Pulse 73   Temp 97.9 F (36.6 C) (Oral)   Resp 16   Wt 220 lb 9.6 oz (100.1 kg)   SpO2 97%   BMI 35.07 kg/m  General appearance: alert, cooperative, fatigued and no distress Head: Normocephalic, without obvious abnormality, atraumatic Eyes: negative findings: lids and  lashes normal, pupils equal, round, reactive to light and accomodation and optic nerve appearance unremarkable, positive findings: conjunctiva: erythematous dry eye Ears: normal TM's and external ear canals both ears Nose: no discharge, turbinates swollen, inflamed, moderate maxillary sinus tenderness bilateral, moderate frontal sinus tenderness bilateral Throat: abnormal findings: moderate oropharyngeal erythema Lungs: clear to auscultation bilaterally Heart: regular rate and rhythm, S1, S2 normal, no murmur, click, rub or gallop Abdomen: soft, non-tender; bowel sounds normal; no masses,  no organomegaly Pulses: 2+ and symmetric Skin: Skin color, texture, turgor normal. No rashes or lesions Lymph nodes: cervical lymphadentopathy bilaterally Neurologic: Grossly normal  Rapid strep test: negative   Assessment:  Acute Pansinusitis and Allergic Conjunctivitis    Plan:  Discussed diagnosis and treatment of sinusitis. Educational material distributed and questions answered. Suggested symptomatic OTC remedies. Supportive care with appropriate antipyretics and fluids. Nasal saline spray for congestion. Doxycycline, Patanol eye drops, Singulair and Ipratroprium Bromide nasal spray per orders. Follow up as needed.  Patient verbalizes understanding and has no questions at time of discharge. Meds ordered this encounter  Medications  . doxycycline (VIBRA-TABS) 100 MG tablet    Sig: Take 1 tablet (100 mg total) by mouth 2 (two) times daily for 10 days.    Dispense:  20 tablet    Refill:  0    Order Specific Question:   Supervising Provider    Answer:   Stacie Glaze [[id number]]  . montelukast (SINGULAIR) 10 MG tablet    Sig: Take 1 tablet (10 mg total) by mouth at bedtime.    Dispense:  30 tablet    Refill:  1    Order Specific Question:   Supervising Provider    Answer:   Stacie Glaze [[id number]]  .  ipratropium (ATROVENT) 0.06 % nasal spray    Sig: Place 2 sprays into both nostrils 4 (four) times  daily.    Dispense:  15 mL    Refill:  12    Order Specific Question:   Supervising Provider    Answer:   Stacie Glaze [[id number]]  . olopatadine (PATANOL) 0.1 % ophthalmic solution    Sig: Place 1 drop into both eyes 2 (two) times daily for 10 days.    Dispense:  5 mL    Refill:  12    Order Specific Question:   Supervising Provider    Answer:   Stacie Glaze [phone]

## 2018-03-28 ENCOUNTER — Telehealth: Payer: Self-pay

## 2018-03-28 NOTE — Telephone Encounter (Signed)
Called Pt but did not get answer . Left vm stating if she have any concerns to come in or give [place] a call.

## 2018-09-27 ENCOUNTER — Ambulatory Visit: Payer: Self-pay | Admitting: Family Medicine

## 2018-09-27 VITALS — BP 130/82 | Temp 98.8°F | Wt 218.0 lb

## 2018-09-27 DIAGNOSIS — J029 Acute pharyngitis, unspecified: Secondary | ICD-10-CM

## 2018-09-27 DIAGNOSIS — Z20818 Contact with and (suspected) exposure to other bacterial communicable diseases: Secondary | ICD-10-CM

## 2018-09-27 LAB — POCT RAPID STREP A (OFFICE): Rapid Strep A Screen: NEGATIVE

## 2018-09-27 NOTE — Progress Notes (Signed)
oc

## 2018-09-27 NOTE — Patient Instructions (Signed)

## 2018-09-27 NOTE — Progress Notes (Signed)
Tracy Campbell is a 62 y.o. female who presents today with concerns of sore throat for 4 days after reported exposure to strep through grandson. She denies running fever or any systemic symptoms at this time.  Review of Systems  Constitutional: Negative for chills, fever and malaise/fatigue.  HENT: Positive for sore throat. Negative for congestion, ear discharge, ear pain and sinus pain.   Eyes: Negative.   Respiratory: Negative for cough, sputum production and shortness of breath.   Cardiovascular: Negative.  Negative for chest pain.  Gastrointestinal: Negative for abdominal pain, diarrhea, nausea and vomiting.  Genitourinary: Negative for dysuria, frequency, hematuria and urgency.  Musculoskeletal: Negative for myalgias.  Skin: Negative.   Neurological: Negative for headaches.  Endo/Heme/Allergies: Negative.   Psychiatric/Behavioral: Negative.     O: Vitals:   09/27/18 1138  BP: 130/82  Temp: 98.8 F (37.1 C)  SpO2: 98%     Physical Exam  Constitutional: She is oriented to person, place, and time. Vital signs are normal. She appears well-developed and well-nourished. She is active.  Non-toxic appearance. She does not have a sickly appearance.  HENT:  Head: Normocephalic.  Right Ear: Hearing, tympanic membrane, external ear and ear canal normal.  Left Ear: Hearing, tympanic membrane, external ear and ear canal normal.  Nose: Nose normal.  Mouth/Throat: Uvula is midline and oropharynx is clear and moist.  Neck: Normal range of motion. Neck supple.  Cardiovascular: Normal rate, regular rhythm, normal heart sounds and normal pulses.  Pulmonary/Chest: Effort normal and breath sounds normal.  Abdominal: Soft. Bowel sounds are normal.  Musculoskeletal: Normal range of motion.  Lymphadenopathy:       Head (right side): No submental and no submandibular adenopathy present.       Head (left side): No submental and no submandibular adenopathy present.    She has no cervical adenopathy.   Neurological: She is alert and oriented to person, place, and time.  Psychiatric: She has a normal mood and affect.  Vitals reviewed.  A: 1. Sore throat   2. Strep throat exposure    P: Discussed exam findings, diagnosis etiology and medication use and indications reviewed with patient. Follow- Up and discharge instructions provided. No emergent/urgent issues found on exam.  Patient verbalized understanding of information provided and agrees with plan of care (POC), all questions answered.  Discussed to follow up if fever develops and advised to assess other factors that can cause sore throat like GERD, allergies or onset of common cold.  1. Sore throat - POCT rapid strep A Results for orders placed or performed in visit on 09/27/18 (from the past 24 hour(s))  POCT rapid strep A     Status: Normal   Collection Time: 09/27/18 12:05 PM  Result Value Ref Range   Rapid Strep A Screen Negative Negative    2. Strep throat exposure Reports grandson has strep last week but states she did not drink after him.

## 2019-03-26 ENCOUNTER — Other Ambulatory Visit: Payer: Self-pay | Admitting: Nurse Practitioner

## 2023-05-01 ENCOUNTER — Ambulatory Visit
Admission: EM | Admit: 2023-05-01 | Discharge: 2023-05-01 | Disposition: A | Discharge status: Home / Self Care | Payer: Medicare Other

## 2023-05-01 ENCOUNTER — Ambulatory Visit (INDEPENDENT_AMBULATORY_CARE_PROVIDER_SITE_OTHER): Payer: Medicare Other

## 2023-05-01 DIAGNOSIS — S60211A Contusion of right wrist, initial encounter: Secondary | ICD-10-CM

## 2023-05-01 DIAGNOSIS — S80212A Abrasion, left knee, initial encounter: Secondary | ICD-10-CM | POA: Diagnosis not present

## 2023-05-01 DIAGNOSIS — S0083XA Contusion of other part of head, initial encounter: Secondary | ICD-10-CM | POA: Diagnosis not present

## 2023-05-01 DIAGNOSIS — Z23 Encounter for immunization: Secondary | ICD-10-CM

## 2023-05-01 DIAGNOSIS — S0081XA Abrasion of other part of head, initial encounter: Secondary | ICD-10-CM | POA: Diagnosis not present

## 2023-05-01 DIAGNOSIS — W19XXXA Unspecified fall, initial encounter: Secondary | ICD-10-CM

## 2023-05-01 HISTORY — DX: Unspecified malignant neoplasm of skin, unspecified: C44.90

## 2023-05-01 HISTORY — DX: Essential (primary) hypertension: I10

## 2023-05-01 MED ORDER — BACITRACIN ZINC 500 UNIT/GM EX OINT: 1.0000 | TOPICAL_OINTMENT | Freq: Two times a day (BID) | CUTANEOUS | 0 refills | Status: AC

## 2023-05-01 MED ORDER — TETANUS-DIPHTH-ACELL PERTUSSIS 5-2.5-18.5 LF-MCG/0.5 IM SUSY
0.5000 mL | PREFILLED_SYRINGE | Freq: Once | INTRAMUSCULAR | Status: AC
  Administered 2023-05-01: 0.5 mL via INTRAMUSCULAR

## 2023-05-01 NOTE — ED Triage Notes (Signed)
Pt tripped/fell in parking lot ~3p-hit face on cement-no LOC-scattered abrasions to hands and left knee and nose- pain to right side of forehead and right wrist-NAD-steady gait

## 2023-05-01 NOTE — ED Provider Notes (Signed)
[name]  Note:  This document was prepared using Conservation officer, historic buildings and may include unintentional dictation errors.  MRN: [id number] DOB: 03-Oct-1956  Subjective:   Tracy Campbell is a 67 y.o. female presenting for suffering multiple injuries today from an accidental fall in a parking lot.  Patient tripped against a defect in the the parking lot/raised pavement.  This caused her to fall forward landing on an outstretched right hand/wrist and making impact with her nose and right side of her face.  Denies loss of consciousness, confusion, weakness, numbness or tingling, vision changes, nausea, vomiting, lacerations.  She has taken Tylenol today.  Normally has meloxicam but has not been home to get it.  She also takes blood pressure medications and is very consistent with it.  No history of stroke.  Cannot recall her last Tdap.  No current facility-administered medications for this encounter.  Current Outpatient Medications:    acetaminophen (TYLENOL) 500 MG tablet, Take 500 mg by mouth every 6 (six) hours as needed., Disp: , Rfl:    ALPRAZolam (XANAX) 0.25 MG tablet, Take 0.25 mg by mouth daily as needed for anxiety., Disp: , Rfl:    azithromycin (ZITHROMAX) 250 MG tablet, 2 tablets today, 1 daily till finished (Patient not taking: Reported on 09/27/2018), Disp: 6 tablet, Rfl: 0   gabapentin (NEURONTIN) 600 MG tablet, TAKE 1 TABLET BY MOUTH 3 TIMES DAILY (Patient not taking: No sig reported), Disp: 30 tablet, Rfl: 0   ipratropium (ATROVENT) 0.06 % nasal spray, Place 2 sprays into both nostrils 4 (four) times daily., Disp: 15 mL, Rfl: 12   lisinopril (ZESTRIL) 5 MG tablet, Take 5 mg by mouth daily., Disp: , Rfl:    meloxicam (MOBIC) 15 MG tablet, Take 15 mg by mouth daily., Disp: , Rfl:    montelukast (SINGULAIR) 10 MG tablet, Take 1 tablet (10 mg total) by mouth at bedtime., Disp: 30 tablet, Rfl: 1   Multiple Vitamin (MULTIVITAMIN WITH MINERALS) TABS tablet,  Take 1 tablet by mouth daily., Disp: , Rfl:    naproxen sodium (ANAPROX) 220 MG tablet, Take 220 mg by mouth daily as needed. For pain, Disp: , Rfl:    Allergies  Allergen Reactions   Penicillins Anaphylaxis    Past Medical History:  Diagnosis Date   Hypertension    Skin cancer    per pt   Trigeminal neuralgia of left side of face 05/13/2014     Past Surgical History:  Procedure Laterality Date   BREAST LUMPECTOMY     left benign   endometrial ablasion     TUBAL LIGATION      Family History  Problem Relation Age of Onset   Alzheimer's disease Mother    Dementia Father     Social History   Tobacco Use   Smoking status: Never   Smokeless tobacco: Never   Tobacco comments:    quit 2010  Vaping Use   Vaping Use: Never used  Substance Use Topics   Alcohol use: Yes    Comment: weekly   Drug use: No    ROS   Objective:   Vitals: BP (!) 181/77 (BP Location: Right Arm)   Pulse 74   Temp 97.9 F (36.6 C) (Oral)   Resp 20   SpO2 95%   BP recheck was 157/89.  Physical Exam Constitutional:      General: She is not in acute distress.    Appearance: Normal appearance. She is well-developed and normal weight.  She is not ill-appearing, toxic-appearing or diaphoretic.  HENT:     Head: Normocephalic and atraumatic.      Right Ear: Tympanic membrane, ear canal and external ear normal. No drainage or tenderness. No middle ear effusion. There is no impacted cerumen. Tympanic membrane is not erythematous or bulging.     Left Ear: Tympanic membrane, ear canal and external ear normal. No drainage or tenderness.  No middle ear effusion. There is no impacted cerumen. Tympanic membrane is not erythematous or bulging.     Nose: No congestion or rhinorrhea.     Mouth/Throat:     Mouth: Mucous membranes are moist. No oral lesions.     Pharynx: No pharyngeal swelling, oropharyngeal exudate, posterior oropharyngeal erythema or uvula swelling.     Tonsils: No tonsillar exudate  or tonsillar abscesses.  Eyes:     General: No scleral icterus.       Right eye: No discharge.        Left eye: No discharge.     Extraocular Movements: Extraocular movements intact.     Right eye: Normal extraocular motion.     Left eye: Normal extraocular motion.     Conjunctiva/sclera: Conjunctivae normal.  Cardiovascular:     Rate and Rhythm: Normal rate.  Pulmonary:     Effort: Pulmonary effort is normal.  Musculoskeletal:        General: Normal range of motion.     Cervical back: Normal range of motion and neck supple.       Legs:  Lymphadenopathy:     Cervical: No cervical adenopathy.  Skin:    General: Skin is warm and dry.  Neurological:     General: No focal deficit present.     Mental Status: She is alert and oriented to person, place, and time.     Cranial Nerves: No cranial nerve deficit.     Motor: No weakness.     Coordination: Coordination normal.     Gait: Gait normal.     Deep Tendon Reflexes: Reflexes normal.  Psychiatric:        Mood and Affect: Mood normal.        Behavior: Behavior normal.    Dressing applied to the left knee.   DG Wrist Complete Right  Result Date: 05/01/2023 CLINICAL DATA:  right wrist pain EXAM: RIGHT WRIST - COMPLETE 3+ VIEW COMPARISON:  None Available. FINDINGS: No acute fracture or dislocation. Normal alignment and mineralization. Corticated ossicle at the ulnar styloid. 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 Regional soft tissues unremarkable. IMPRESSION: 1. No acute findings. 2. Advanced first MCP DJD. Electronically Signed   By: Corlis Leak M.D.   On: 05/01/2023 17:49    I applied a 2 inch Ace wrap to the right wrist and hand. Tdap updated in clinic.  Assessment and Plan :   PDMP not reviewed this encounter.  1. Facial contusion, initial encounter   2. Facial abrasion, initial encounter   3. Abrasion of left knee, initial encounter   4. Contusion of right wrist, initial encounter   5. Accidental fall, initial encounter     Will defer ER visit for CT imaging.  Patient does have tenderness over the superior lateral aspect of the right orbit but is well-tolerated.  There is no swelling.  Low suspicion for fracture of the nose.  Will defer imaging for this.  X-ray was negative for the wrist.  Recommended managing conservatively for multiple contusions, abrasions. Counseled patient on potential for adverse effects  with medications prescribed/recommended today, ER and return-to-clinic precautions discussed, patient verbalized understanding.    Wallis Bamberg, [place] 05/01/23 1610

## 2023-12-22 ENCOUNTER — Other Ambulatory Visit: Payer: Self-pay | Admitting: Medical Genetics

## 2023-12-22 ENCOUNTER — Other Ambulatory Visit (HOSPITAL_BASED_OUTPATIENT_CLINIC_OR_DEPARTMENT_OTHER): Payer: Self-pay | Admitting: Cardiology

## 2023-12-22 DIAGNOSIS — Z8249 Family history of ischemic heart disease and other diseases of the circulatory system: Secondary | ICD-10-CM

## 2023-12-22 DIAGNOSIS — Z006 Encounter for examination for normal comparison and control in clinical research program: Secondary | ICD-10-CM

## 2023-12-23 ENCOUNTER — Telehealth: Payer: Medicare Other | Admitting: Family Medicine

## 2023-12-23 DIAGNOSIS — J111 Influenza due to unidentified influenza virus with other respiratory manifestations: Secondary | ICD-10-CM | POA: Diagnosis not present

## 2023-12-23 MED ORDER — OSELTAMIVIR PHOSPHATE 75 MG PO CAPS: 75.0000 mg | ORAL_CAPSULE | Freq: Two times a day (BID) | ORAL | 0 refills | Status: AC

## 2023-12-23 NOTE — Progress Notes (Signed)

## 2023-12-26 ENCOUNTER — Inpatient Hospital Stay (HOSPITAL_BASED_OUTPATIENT_CLINIC_OR_DEPARTMENT_OTHER): Admission: RE | Admit: 2023-12-26 | Payer: Medicare Other | Source: Ambulatory Visit | Admitting: Radiology

## 2024-01-05 LAB — GENECONNECT MOLECULAR SCREEN: Genetic Analysis Overall Interpretation: NEGATIVE

## 2024-03-12 ENCOUNTER — Encounter: Payer: Self-pay | Admitting: Dermatology

## 2024-03-12 ENCOUNTER — Ambulatory Visit: Admitting: Dermatology

## 2024-03-12 VITALS — BP 134/80 | HR 63

## 2024-03-12 DIAGNOSIS — L578 Other skin changes due to chronic exposure to nonionizing radiation: Secondary | ICD-10-CM | POA: Diagnosis not present

## 2024-03-12 DIAGNOSIS — W908XXA Exposure to other nonionizing radiation, initial encounter: Secondary | ICD-10-CM | POA: Diagnosis not present

## 2024-03-12 DIAGNOSIS — L570 Actinic keratosis: Secondary | ICD-10-CM

## 2024-03-12 DIAGNOSIS — D492 Neoplasm of unspecified behavior of bone, soft tissue, and skin: Secondary | ICD-10-CM

## 2024-03-12 DIAGNOSIS — L905 Scar conditions and fibrosis of skin: Secondary | ICD-10-CM

## 2024-03-12 DIAGNOSIS — D485 Neoplasm of uncertain behavior of skin: Secondary | ICD-10-CM

## 2024-03-12 DIAGNOSIS — L820 Inflamed seborrheic keratosis: Secondary | ICD-10-CM

## 2024-03-12 DIAGNOSIS — L821 Other seborrheic keratosis: Secondary | ICD-10-CM | POA: Diagnosis not present

## 2024-03-12 NOTE — Progress Notes (Signed)
 New Patient Visit   Subjective  Tracy Campbell is a 68 y.o. female who presents for the following: growth on the back Pt has a growth on the back for a couple months that has changed in appearance.  Pt has possible hx of skin cancer  The following portions of the chart were reviewed this encounter and updated as appropriate: medications, allergies, medical history  Review of Systems:  No other skin or systemic complaints except as noted in HPI or Assessment and Plan.  Objective  Well appearing patient in no apparent distress; mood and affect are within normal limits.  A focused examination was performed of the following areas: back  Relevant exam findings are noted in the Assessment and Plan.  Mid Back 1.0 cm Irregular brown papule  Assessment & Plan   ACTINIC DAMAGE - chronic, secondary to cumulative UV radiation exposure/sun exposure over time - diffuse scaly erythematous macules with underlying dyspigmentation - Recommend daily broad spectrum sunscreen SPF 30+ to sun-exposed areas, reapply every 2 hours as needed.  - Recommend staying in the shade or wearing long sleeves, sun glasses (UVA+UVB protection) and wide brim hats (4-inch brim around the entire circumference of the hat). - Call for new or changing lesions.  SEBORRHEIC KERATOSIS- bilateral forearms - Stuck-on, waxy, tan-brown papules and/or plaques  - Benign-appearing - Discussed benign etiology and prognosis. - Observe - Call for any changes  ACTINIC KERATOSIS Exam: Erythematous thin papules/macules with gritty scale  Actinic keratoses are precancerous spots that appear secondary to cumulative UV radiation exposure/sun exposure over time. They are chronic with expected duration over 1 year. A portion of actinic keratoses will progress to squamous cell carcinoma of the skin. It is not possible to reliably predict which spots will progress to skin cancer and so treatment is recommended to prevent development of skin  cancer.  Recommend daily broad spectrum sunscreen SPF 30+ to sun-exposed areas, reapply every 2 hours as needed.  Recommend staying in the shade or wearing long sleeves, sun glasses (UVA+UVB protection) and wide brim hats (4-inch brim around the entire circumference of the hat). Call for new or changing lesions.  Treatment Plan:  Prior to procedure, discussed risks of blister formation, small wound, skin dyspigmentation, or rare scar following cryotherapy. Recommend Vaseline ointment to treated areas while healing.  Destruction Procedure Note Destruction method: cryotherapy   Informed consent: discussed and consent obtained   Lesion destroyed using liquid nitrogen: Yes   Outcome: patient tolerated procedure well with no complications   Post-procedure details: wound care instructions given   Locations: right cheek # of Lesions Treated: 1  Scar- Left arm - History of something previously treated at [facility] - Patient will sign record release form  NEOPLASM OF UNCERTAIN BEHAVIOR OF SKIN Mid Back Skin / nail biopsy Type of biopsy: tangential   Informed consent: discussed and consent obtained   Timeout: patient name, date of birth, surgical site, and procedure verified   Procedure prep:  Patient was prepped and draped in usual sterile fashion Prep type:  Isopropyl alcohol Anesthesia: the lesion was anesthetized in a standard fashion   Anesthetic:  1% lidocaine w/ epinephrine 1-100,000 buffered w/ 8.4% NaHCO3 Instrument used: DermaBlade   Hemostasis achieved with: pressure and aluminum chloride   Outcome: patient tolerated procedure well   Post-procedure details: sterile dressing applied and wound care instructions given   Dressing type: pressure dressing and bandage   Specimen 1 - Surgical pathology Differential Diagnosis: R/O DN vs MM vs sk  vs other  Check Margins: No AK (ACTINIC KERATOSIS) Right Zygomatic Area Destruction of lesion - Right Zygomatic  Area Complexity: simple   Destruction method: cryotherapy   Timeout:  patient name, date of birth, surgical site, and procedure verified Outcome: patient tolerated procedure well with no complications   Post-procedure details: wound care instructions given    Return in about 6 months (around 09/11/2024).  I, Wilson Hasten, CMA, am acting as scribe for Deneise Finlay, MD.   Documentation: I have reviewed the above documentation for accuracy and completeness, and I agree with the above.  Deneise Finlay, MD

## 2024-03-12 NOTE — Patient Instructions (Addendum)

## 2024-03-18 LAB — SURGICAL PATHOLOGY
